# Patient Record
Sex: Male | Born: 1990 | Race: White | Hispanic: No | Marital: Single | Smoking: Former smoker
Health system: Southern US, Community
[De-identification: ages and names within clinical notes are randomized; demographics above are authoritative.]

---

## 2015-11-08 ENCOUNTER — Encounter (HOSPITAL_COMMUNITY): Payer: Self-pay

## 2015-11-08 ENCOUNTER — Emergency Department (HOSPITAL_COMMUNITY)

## 2015-11-08 ENCOUNTER — Emergency Department (HOSPITAL_COMMUNITY)
Admission: EM | Admit: 2015-11-08 | Discharge: 2015-11-08 | Disposition: A | Discharge status: Home / Self Care | Attending: Emergency Medicine | Admitting: Emergency Medicine

## 2015-11-08 DIAGNOSIS — S0990XA Unspecified injury of head, initial encounter: Secondary | ICD-10-CM | POA: Diagnosis not present

## 2015-11-08 DIAGNOSIS — S0592XA Unspecified injury of left eye and orbit, initial encounter: Secondary | ICD-10-CM | POA: Diagnosis present

## 2015-11-08 DIAGNOSIS — S0502XA Injury of conjunctiva and corneal abrasion without foreign body, left eye, initial encounter: Secondary | ICD-10-CM | POA: Insufficient documentation

## 2015-11-08 DIAGNOSIS — Y9289 Other specified places as the place of occurrence of the external cause: Secondary | ICD-10-CM | POA: Insufficient documentation

## 2015-11-08 DIAGNOSIS — S0511XA Contusion of eyeball and orbital tissues, right eye, initial encounter: Secondary | ICD-10-CM

## 2015-11-08 DIAGNOSIS — Z23 Encounter for immunization: Secondary | ICD-10-CM | POA: Insufficient documentation

## 2015-11-08 DIAGNOSIS — Z87891 Personal history of nicotine dependence: Secondary | ICD-10-CM | POA: Diagnosis not present

## 2015-11-08 DIAGNOSIS — Y998 Other external cause status: Secondary | ICD-10-CM | POA: Diagnosis not present

## 2015-11-08 DIAGNOSIS — Y9389 Activity, other specified: Secondary | ICD-10-CM | POA: Diagnosis not present

## 2015-11-08 MED ORDER — NAPROXEN 500 MG PO TABS: 500.0000 mg | ORAL_TABLET | Freq: Two times a day (BID) | ORAL | Status: AC

## 2015-11-08 MED ORDER — ERYTHROMYCIN 5 MG/GM OP OINT: TOPICAL_OINTMENT | OPHTHALMIC | Status: AC

## 2015-11-08 MED ORDER — HYDROCODONE-ACETAMINOPHEN 5-325 MG PO TABS
1.0000 | ORAL_TABLET | Freq: Once | ORAL | Status: AC
  Administered 2015-11-08: 1 via ORAL
  Filled 2015-11-08: qty 1

## 2015-11-08 MED ORDER — TETRACAINE HCL 0.5 % OP SOLN
2.0000 [drp] | Freq: Once | OPHTHALMIC | Status: AC
  Administered 2015-11-08: 2 [drp] via OPHTHALMIC
  Filled 2015-11-08: qty 4

## 2015-11-08 MED ORDER — FLUORESCEIN SODIUM 1 MG OP STRP
1.0000 | ORAL_STRIP | Freq: Once | OPHTHALMIC | Status: AC
  Administered 2015-11-08: 1 via OPHTHALMIC
  Filled 2015-11-08: qty 1

## 2015-11-08 MED ORDER — HYDROCODONE-ACETAMINOPHEN 5-325 MG PO TABS: 1.0000 | ORAL_TABLET | Freq: Four times a day (QID) | ORAL | Status: AC | PRN

## 2015-11-08 MED ORDER — TETANUS-DIPHTH-ACELL PERTUSSIS 5-2.5-18.5 LF-MCG/0.5 IM SUSP
0.5000 mL | Freq: Once | INTRAMUSCULAR | Status: AC
  Administered 2015-11-08: 0.5 mL via INTRAMUSCULAR
  Filled 2015-11-08: qty 0.5
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
## 2015-11-08 NOTE — ED Notes (Signed)
Patient discharged back to prison
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
## 2015-11-08 NOTE — Discharge Instructions (Signed)
Corneal Abrasion The cornea is the clear covering at the front and center of the eye. When looking at the colored portion of the eye (iris), you are looking through the cornea. This very thin tissue is made up of many layers. The surface layer is a single layer of cells (corneal epithelium) and is one of the most sensitive tissues in the body. If a scratch or injury causes the corneal epithelium to come off, it is called a corneal abrasion. If the injury extends to the tissues below the epithelium, the condition is called a corneal ulcer. CAUSES   Scratches.  Trauma.  Foreign body in the eye. Some people have recurrences of abrasions in the area of the original injury even after it has healed (recurrent erosion syndrome). Recurrent erosion syndrome generally improves and goes away with time. SYMPTOMS   Eye pain.  Difficulty or inability to keep the injured eye open.  The eye becomes very sensitive to light.  Recurrent erosions tend to happen suddenly, first thing in the morning, usually after waking up and opening the eye. DIAGNOSIS  Your health care provider can diagnose a corneal abrasion during an eye exam. Dye is usually placed in the eye using a drop or a small paper strip moistened by your tears. When the eye is examined with a special light, the abrasion shows up clearly because of the dye. TREATMENT   Small abrasions may be treated with antibiotic drops or ointment alone.  A pressure patch may be put over the eye. If this is done, follow your doctor's instructions for when to remove the patch. Do not drive or use machines while the eye patch is on. Judging distances is hard to do with a patch on. If the abrasion becomes infected and spreads to the deeper tissues of the cornea, a corneal ulcer can result. This is serious because it can cause corneal scarring. Corneal scars interfere with light passing through the cornea and cause a loss of vision in the involved eye. HOME CARE  INSTRUCTIONS  Use medicine or ointment as directed. Only take over-the-counter or prescription medicines for pain, discomfort, or fever as directed by your health care provider.  Do not drive or operate machinery if your eye is patched. Your ability to judge distances is impaired.  If your health care provider has given you a follow-up appointment, it is very important to keep that appointment. Not keeping the appointment could result in a severe eye infection or permanent loss of vision. If there is any problem keeping the appointment, let your health care provider know. SEEK MEDICAL CARE IF:   You have pain, light sensitivity, and a scratchy feeling in one eye or both eyes.  Your pressure patch keeps loosening up, and you can blink your eye under the patch after treatment.  Any kind of discharge develops from the eye after treatment or if the lids stick together in the morning.  You have the same symptoms in the morning as you did with the original abrasion days, weeks, or months after the abrasion healed.   This information is not intended to replace advice given to you by your health care provider. Make sure you discuss any questions you have with your health care provider.   Document Released: 08/23/2000 Document Revised: 05/17/2015 Document Reviewed: 05/03/2013 Elsevier Interactive Patient Education 2016 [employer]  Contusion A contusion is a deep bruise. Contusions are the result of a blunt injury to tissues and muscle fibers under the skin. The injury  causes bleeding under the skin. The skin overlying the contusion may turn blue, purple, or yellow. Minor injuries will give you a painless contusion, but more severe contusions may stay painful and swollen for a few weeks.  CAUSES  This condition is usually caused by a blow, trauma, or direct force to an area of the body. SYMPTOMS  Symptoms of this condition include:  Swelling of the injured area.  Pain and tenderness in the  injured area.  Discoloration. The area may have redness and then turn blue, purple, or yellow. DIAGNOSIS  This condition is diagnosed based on a physical exam and medical history. An X-ray, CT scan, or MRI may be needed to determine if there are any associated injuries, such as broken bones (fractures). TREATMENT  Specific treatment for this condition depends on what area of the body was injured. In general, the best treatment for a contusion is resting, icing, applying pressure to (compression), and elevating the injured area. This is often called the RICE strategy. Over-the-counter anti-inflammatory medicines may also be recommended for pain control.  HOME CARE INSTRUCTIONS   Rest the injured area.  If directed, apply ice to the injured area:  Put ice in a plastic bag.  Place a towel between your skin and the bag.  Leave the ice on for 20 minutes, 2-3 times per day.  If directed, apply light compression to the injured area using an elastic bandage. Make sure the bandage is not wrapped too tightly. Remove and reapply the bandage as directed by your health care provider.  If possible, raise (elevate) the injured area above the level of your heart while you are sitting or lying down.  Take over-the-counter and prescription medicines only as told by your health care provider. SEEK MEDICAL CARE IF:  Your symptoms do not improve after several days of treatment.  Your symptoms get worse.  You have difficulty moving the injured area. SEEK IMMEDIATE MEDICAL CARE IF:   You have severe pain.  You have numbness in a hand or foot.  Your hand or foot turns pale or cold.   This information is not intended to replace advice given to you by your health care provider. Make sure you discuss any questions you have with your health care provider.   Document Released: 06/05/2005 Document Revised: 05/17/2015 Document Reviewed: 01/11/2015 Elsevier Interactive Patient Education [employer].
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
